# Patient Record
Sex: Male | Born: 2000 | Race: Black or African American | Hispanic: Yes | Marital: Single | Smoking: Never smoker
Health system: Southern US, Community
[De-identification: ages and names within clinical notes are randomized; demographics above are authoritative.]

---
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
## 2018-11-21 ENCOUNTER — Encounter (HOSPITAL_COMMUNITY): Payer: Self-pay | Admitting: Emergency Medicine

## 2018-11-21 ENCOUNTER — Other Ambulatory Visit: Payer: Self-pay

## 2018-11-21 ENCOUNTER — Ambulatory Visit (HOSPITAL_COMMUNITY)
Admission: EM | Admit: 2018-11-21 | Discharge: 2018-11-21 | Disposition: A | Discharge status: Nursing Home (Includes ICF) | Source: Home / Self Care

## 2018-11-21 ENCOUNTER — Emergency Department (HOSPITAL_COMMUNITY)

## 2018-11-21 ENCOUNTER — Emergency Department (HOSPITAL_COMMUNITY)
Admission: EM | Admit: 2018-11-21 | Discharge: 2018-11-21 | Disposition: A | Discharge status: Home / Self Care | Attending: Emergency Medicine | Admitting: Emergency Medicine

## 2018-11-21 DIAGNOSIS — S43014A Anterior dislocation of right humerus, initial encounter: Secondary | ICD-10-CM | POA: Diagnosis present

## 2018-11-21 DIAGNOSIS — Y33XXXA Other specified events, undetermined intent, initial encounter: Secondary | ICD-10-CM | POA: Insufficient documentation

## 2018-11-21 DIAGNOSIS — Y929 Unspecified place or not applicable: Secondary | ICD-10-CM | POA: Insufficient documentation

## 2018-11-21 DIAGNOSIS — Y998 Other external cause status: Secondary | ICD-10-CM | POA: Diagnosis not present

## 2018-11-21 DIAGNOSIS — S43004A Unspecified dislocation of right shoulder joint, initial encounter: Secondary | ICD-10-CM

## 2018-11-21 DIAGNOSIS — Y939 Activity, unspecified: Secondary | ICD-10-CM | POA: Insufficient documentation

## 2018-11-21 MED ORDER — ONDANSETRON HCL 4 MG/2ML IJ SOLN: 4.0000 mg | Freq: Once | INTRAMUSCULAR | Status: DC

## 2018-11-21 MED ORDER — HYDROCODONE-ACETAMINOPHEN 5-325 MG PO TABS
1.0000 | ORAL_TABLET | Freq: Once | ORAL | Status: AC
  Administered 2018-11-21: 1 via ORAL
  Filled 2018-11-21: qty 1

## 2018-11-21 MED ORDER — HYDROMORPHONE HCL 1 MG/ML IJ SOLN: 1.0000 mg | Freq: Once | INTRAMUSCULAR | Status: DC

## 2018-11-21 NOTE — ED Triage Notes (Signed)
Pt in from home with possible R shoulder dislocation. Hx of same in past, this happened today while he was sitting in recliner and reaching back for something.

## 2018-11-21 NOTE — ED Notes (Signed)
Patient transported to X-ray 

## 2018-11-21 NOTE — ED Notes (Signed)
Pt laid back in bed, R shoulder "pop" heard and pt stated he was able to now move full ROM. PA notified and assessed.

## 2018-11-21 NOTE — Discharge Instructions (Signed)
Please read and follow all provided instructions.  Your diagnoses today include:  1. Dislocation of right shoulder joint, initial encounter     Tests performed today include:  An x-ray of the affected area - does NOT show any broken bones  Vital signs. See below for your results today.   Medications prescribed:  Please use over-the-counter NSAID medications (ibuprofen, naproxen) as directed on the packaging for pain.   Take any prescribed medications only as directed.   Home care instructions:   Follow any educational materials contained in this packet  Follow R.I.C.E. Protocol:  R - rest your injury   I  - use ice on injury without applying directly to skin  C - compress injury with bandage or splint  E - elevate the injury as much as possible  Follow-up instructions: Please follow-up with the provided orthopedic physician (bone specialist) in 1 week. In this case you may have a more severe injury that requires further care.   Return instructions:   Please return if your fingers are numb or tingling, appear gray or blue, or you have severe pain (also elevate the arm and loosen splint or wrap if you were given one)  Please return to the Emergency Department if you experience worsening symptoms.   Please return if you have any other emergent concerns.  Additional Information:  Your vital signs today were: BP 123/79    Pulse 90    Wt 89.8 kg    SpO2 98%    BMI 27.62 kg/m  If your blood pressure (BP) was elevated above 135/85 this visit, please have this repeated by your doctor within one month. --------------

## 2018-11-21 NOTE — ED Provider Notes (Signed)
[facility] EMERGENCY DEPARTMENT Provider Note   CSN: [id number] Arrival date & time: 11/21/18  1217     History   Chief Complaint Chief Complaint  Patient presents with  . shoulder dislocation    HPI Marc Gardner is a 18 y.o. male.  Patient presents the emergency department with acute right shoulder dislocation.  Patient has had 2 shoulder dislocations that he reports in the past that resolved spontaneously.  He states that he was reaching for something while sitting on a couch today and his shoulder came out of place.  This occurred just prior to arrival.  No numbness or tingling in the arms.  He complains of pain in the shoulder and is unable to move his arm.  No other injuries.  No treatments prior to arrival.     History reviewed. No pertinent past medical history.  There are no active problems to display for this patient.   History reviewed. No pertinent surgical history.      Home Medications    Prior to Admission medications   Medication Sig Start Date End Date Taking? Authorizing Provider  ibuprofen (ADVIL,MOTRIN) 800 MG tablet Take 1 tablet (800 mg total) by mouth 3 (three) times daily. 06/20/17   Garlon Hatchet, PA-C    Family History No family history on file.  Social History Social History   Tobacco Use  . Smoking status: Never Smoker  . Smokeless tobacco: Never Used  Substance Use Topics  . Alcohol use: No  . Drug use: No     Allergies   Patient has no known allergies.   Review of Systems Review of Systems  Musculoskeletal: Positive for arthralgias. Negative for back pain, gait problem, joint swelling and neck pain.  Skin: Negative for wound.  Neurological: Negative for weakness and numbness.     Physical Exam Updated Vital Signs Wt 89.8 kg   BMI 27.62 kg/m   Physical Exam Vitals signs and nursing note reviewed.  Constitutional:      Appearance: He is well-developed.  HENT:     Head: Normocephalic  and atraumatic.  Eyes:     General:        Right eye: No discharge.        Left eye: No discharge.     Conjunctiva/sclera: Conjunctivae normal.  Neck:     Musculoskeletal: Normal range of motion and neck supple.  Cardiovascular:     Rate and Rhythm: Normal rate and regular rhythm.     Pulses:          Radial pulses are 2+ on the right side and 2+ on the left side.     Heart sounds: Normal heart sounds.  Pulmonary:     Effort: Pulmonary effort is normal.     Breath sounds: Normal breath sounds.  Abdominal:     Palpations: Abdomen is soft.     Tenderness: There is no abdominal tenderness.  Musculoskeletal:     Right shoulder: He exhibits decreased range of motion, tenderness and deformity.     Right elbow: Normal.    Cervical back: He exhibits normal range of motion, no tenderness and no bony tenderness.     Right upper arm: Normal.  Skin:    General: Skin is warm and dry.  Neurological:     Mental Status: He is alert.      ED Treatments / Results  Labs (all labs ordered are listed, but only abnormal results are displayed) Labs Reviewed - No data  to display  EKG None  Radiology Dg Shoulder Right  Result Date: 11/21/2018 CLINICAL DATA:  Possible dislocation. EXAM: RIGHT SHOULDER - 2+ VIEW COMPARISON:  Right shoulder x-rays dated September 04, 2017. FINDINGS: There is no evidence of fracture or dislocation. There is no evidence of arthropathy or other focal bone abnormality. Soft tissues are unremarkable. IMPRESSION: Negative. Electronically Signed   By: Obie DredgeWilliam T Derry M.D.   On: 11/21/2018 14:03    Procedures Procedures (including critical care time)  Medications Ordered in ED Medications  HYDROcodone-acetaminophen (NORCO/VICODIN) 5-325 MG per tablet 1 tablet (1 tablet Oral Given 11/21/18 1422)     Initial Impression / Assessment and Plan / ED Course  I have reviewed the triage vital signs and the nursing notes.  Pertinent labs & imaging results that were  available during my care of the patient were reviewed by me and considered in my medical decision making (see chart for details).     Patient seen and examined. Work-up initiated. Medications ordered.   Vital signs reviewed and are as follows: BP 123/79   Pulse 90   Wt 89.8 kg   SpO2 98%   BMI 27.62 kg/m   Called back to patient's room right after leaving.  Patient sat back in the bed and felt his shoulder "get sucked back in".  Visually, it appears that his shoulder has spontaneously reduced.  Will obtain x-ray.  2:46 PM x-ray negative.  Patient has received a sling.  Plan at this time is to discharged home with sling.  Will give orthopedic follow-up as this is the patient's third instance of a suspected shoulder dislocation.  Encouraged return with relapse or worsening.  Final Clinical Impressions(s) / ED Diagnoses   Final diagnoses:  Dislocation of right shoulder joint, initial encounter   Patient with temporary shoulder dislocation.  Patient with obvious defect visually and to palpation on initial exam.  Spontaneously reduced in the emergency department.  Home with ortho follow-up.   ED Discharge Orders    None       Renne CriglerGeiple, Loyda Costin, PA-C 11/21/18 1457    Virgina NorfolkCuratolo, Adam, DO 11/21/18 1605

## 2018-11-21 NOTE — ED Notes (Signed)
Medium sling arm foam applied to R arm.

## 2018-11-30 ENCOUNTER — Ambulatory Visit (INDEPENDENT_AMBULATORY_CARE_PROVIDER_SITE_OTHER)

## 2018-11-30 ENCOUNTER — Ambulatory Visit (INDEPENDENT_AMBULATORY_CARE_PROVIDER_SITE_OTHER): Admitting: Orthopaedic Surgery

## 2018-11-30 ENCOUNTER — Encounter (INDEPENDENT_AMBULATORY_CARE_PROVIDER_SITE_OTHER): Payer: Self-pay | Admitting: Orthopaedic Surgery

## 2018-11-30 DIAGNOSIS — G8929 Other chronic pain: Secondary | ICD-10-CM | POA: Insufficient documentation

## 2018-11-30 DIAGNOSIS — M25511 Pain in right shoulder: Secondary | ICD-10-CM
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
## 2018-12-02 ENCOUNTER — Emergency Department (HOSPITAL_COMMUNITY)

## 2018-12-02 ENCOUNTER — Encounter (HOSPITAL_COMMUNITY): Payer: Self-pay

## 2018-12-02 ENCOUNTER — Emergency Department (HOSPITAL_COMMUNITY)
Admission: EM | Admit: 2018-12-02 | Discharge: 2018-12-03 | Disposition: A | Discharge status: Home / Self Care | Attending: Emergency Medicine | Admitting: Emergency Medicine

## 2018-12-02 DIAGNOSIS — R509 Fever, unspecified: Secondary | ICD-10-CM

## 2018-12-02 DIAGNOSIS — R1110 Vomiting, unspecified: Secondary | ICD-10-CM | POA: Diagnosis not present

## 2018-12-02 DIAGNOSIS — J111 Influenza due to unidentified influenza virus with other respiratory manifestations: Secondary | ICD-10-CM | POA: Diagnosis not present

## 2018-12-02 DIAGNOSIS — R69 Illness, unspecified: Secondary | ICD-10-CM

## 2018-12-02 DIAGNOSIS — R51 Headache: Secondary | ICD-10-CM | POA: Insufficient documentation

## 2018-12-02 DIAGNOSIS — R0981 Nasal congestion: Secondary | ICD-10-CM | POA: Diagnosis not present

## 2018-12-02 DIAGNOSIS — M7918 Myalgia, other site: Secondary | ICD-10-CM | POA: Insufficient documentation

## 2018-12-02 LAB — COMPREHENSIVE METABOLIC PANEL
ALT: 11 U/L (ref 0–44)
ANION GAP: 12 (ref 5–15)
AST: 22 U/L (ref 15–41)
Albumin: 4.2 g/dL (ref 3.5–5.0)
Alkaline Phosphatase: 57 U/L (ref 38–126)
BILIRUBIN TOTAL: 0.7 mg/dL (ref 0.3–1.2)
BUN: 7 mg/dL (ref 6–20)
CHLORIDE: 106 mmol/L (ref 98–111)
CO2: 18 mmol/L — ABNORMAL LOW (ref 22–32)
Calcium: 9.4 mg/dL (ref 8.9–10.3)
Creatinine, Ser: 1 mg/dL (ref 0.61–1.24)
Glucose, Bld: 107 mg/dL — ABNORMAL HIGH (ref 70–99)
POTASSIUM: 3.3 mmol/L — AB (ref 3.5–5.1)
Sodium: 136 mmol/L (ref 135–145)
TOTAL PROTEIN: 7.4 g/dL (ref 6.5–8.1)

## 2018-12-02 LAB — CBC WITH DIFFERENTIAL/PLATELET
Abs Immature Granulocytes: 0.03 10*3/uL (ref 0.00–0.07)
BASOS ABS: 0 10*3/uL (ref 0.0–0.1)
Basophils Relative: 0 %
Eosinophils Absolute: 0.1 10*3/uL (ref 0.0–0.5)
Eosinophils Relative: 1 %
HEMATOCRIT: 40.2 % (ref 39.0–52.0)
Hemoglobin: 13.4 g/dL (ref 13.0–17.0)
Immature Granulocytes: 0 %
LYMPHS PCT: 10 %
Lymphs Abs: 0.7 10*3/uL (ref 0.7–4.0)
MCH: 30.7 pg (ref 26.0–34.0)
MCHC: 33.3 g/dL (ref 30.0–36.0)
MCV: 92 fL (ref 80.0–100.0)
Monocytes Absolute: 1 10*3/uL (ref 0.1–1.0)
Monocytes Relative: 15 %
NEUTROS ABS: 5.1 10*3/uL (ref 1.7–7.7)
NEUTROS PCT: 74 %
NRBC: 0 % (ref 0.0–0.2)
Platelets: 230 10*3/uL (ref 150–400)
RBC: 4.37 MIL/uL (ref 4.22–5.81)
RDW: 11.8 % (ref 11.5–15.5)
WBC: 6.9 10*3/uL (ref 4.0–10.5)

## 2018-12-02 MED ORDER — ACETAMINOPHEN 500 MG PO TABS
1000.0000 mg | ORAL_TABLET | Freq: Once | ORAL | Status: AC
  Administered 2018-12-02: 1000 mg via ORAL
  Filled 2018-12-02: qty 2

## 2018-12-02 NOTE — ED Triage Notes (Signed)
Pt endorses flu sx with cough, bodyaches, fever, vomiting that began this morning. Did not get flu shot. Febrile.

## 2018-12-03 MED ORDER — OSELTAMIVIR PHOSPHATE 75 MG PO CAPS: 75.0000 mg | ORAL_CAPSULE | Freq: Two times a day (BID) | ORAL | 0 refills | Status: DC

## 2018-12-03 MED ORDER — ACETAMINOPHEN 500 MG PO TABS
1000.0000 mg | ORAL_TABLET | Freq: Once | ORAL | Status: AC
  Administered 2018-12-03: 1000 mg via ORAL
  Filled 2018-12-03: qty 2

## 2018-12-03 MED ORDER — IBUPROFEN 200 MG PO TABS
600.0000 mg | ORAL_TABLET | Freq: Once | ORAL | Status: AC
  Administered 2018-12-03: 02:00:00 600 mg via ORAL
  Filled 2018-12-03: qty 1

## 2018-12-03 MED ORDER — ONDANSETRON 4 MG PO TBDP: ORAL_TABLET | ORAL | 0 refills | Status: DC

## 2018-12-03 MED ORDER — POTASSIUM CHLORIDE CRYS ER 20 MEQ PO TBCR
40.0000 meq | EXTENDED_RELEASE_TABLET | Freq: Once | ORAL | Status: AC
  Administered 2018-12-03: 40 meq via ORAL
  Filled 2018-12-03: qty 2

## 2018-12-03 MED ORDER — SODIUM CHLORIDE 0.9 % IV BOLUS
1000.0000 mL | Freq: Once | INTRAVENOUS | Status: AC
  Administered 2018-12-03: 1000 mL via INTRAVENOUS

## 2018-12-03 MED ORDER — BENZONATATE 100 MG PO CAPS: 100.0000 mg | ORAL_CAPSULE | Freq: Three times a day (TID) | ORAL | 0 refills | Status: DC | PRN

## 2018-12-03 MED ORDER — SODIUM CHLORIDE 0.9 % IV BOLUS
1000.0000 mL | Freq: Once | INTRAVENOUS | Status: AC
Start: 2018-12-03 — End: 2018-12-03
  Administered 2018-12-03: 1000 mL via INTRAVENOUS

## 2018-12-03 NOTE — ED Provider Notes (Signed)
[facility] EMERGENCY DEPARTMENT Provider Note   CSN: [id number] Arrival date & time: 12/02/18  1846     History   Chief Complaint Chief Complaint  Patient presents with  . Influenza    HPI Marc Gardner is a 18 y.o. male with a hx of eosinophilic esophagitis presents to the Emergency Department complaining of gradual, persistent, progressively worsening myalgias onset this morning. Associated symptoms include chills, rigors, body aches, nasal congestion, cough, post nasal drip, mild headache, emesis x2.  Pt reports emesis was NBNB.  Pt denies known sick contacts.  He did not get a flu shot this year.  Pt reports taking dayquil without relief of symptoms.   No aggravating factors.  Pt denies neck pain, chest pain, SOB, abd pain, syncope, dysuria.       The history is provided by the patient and medical records. No language interpreter was used.    History reviewed. No pertinent past medical history.  Patient Active Problem List   Diagnosis Date Noted  . Chronic right shoulder pain 11/30/2018    History reviewed. No pertinent surgical history.      Home Medications    Prior to Admission medications   Medication Sig Start Date End Date Taking? Authorizing Provider  benzonatate (TESSALON PERLES) 100 MG capsule Take 1 capsule (100 mg total) by mouth 3 (three) times daily as needed for cough (cough). 12/03/18   Juleah Paradise, Dahlia ClientHannah, PA-C  ondansetron (ZOFRAN ODT) 4 MG disintegrating tablet 4mg  ODT q4 hours prn nausea/vomit 12/03/18   Elijah Michaelis, Dahlia ClientHannah, PA-C  oseltamivir (TAMIFLU) 75 MG capsule Take 1 capsule (75 mg total) by mouth every 12 (twelve) hours. 12/03/18   Shyvonne Chastang, Dahlia ClientHannah, PA-C    Family History History reviewed. No pertinent family history.  Social History Social History   Tobacco Use  . Smoking status: Never Smoker  . Smokeless tobacco: Never Used  Substance Use Topics  . Alcohol use: No  . Drug use: No     Allergies     Patient has no known allergies.   Review of Systems Review of Systems  Constitutional: Positive for chills, fatigue and fever. Negative for appetite change.  HENT: Positive for congestion, postnasal drip, rhinorrhea, sinus pressure and sore throat. Negative for ear discharge, ear pain and mouth sores.   Eyes: Negative for visual disturbance.  Respiratory: Positive for cough, chest tightness, shortness of breath and wheezing. Negative for stridor.   Cardiovascular: Negative for chest pain, palpitations and leg swelling.  Gastrointestinal: Positive for vomiting ( x2). Negative for abdominal pain, diarrhea and nausea.  Genitourinary: Negative for dysuria, frequency, hematuria and urgency.  Musculoskeletal: Negative for arthralgias, back pain, myalgias and neck stiffness.  Skin: Negative for rash.  Neurological: Positive for headaches. Negative for syncope, light-headedness and numbness.  Hematological: Negative for adenopathy.  Psychiatric/Behavioral: The patient is not nervous/anxious.   All other systems reviewed and are negative.    Physical Exam Updated Vital Signs BP 121/69 (BP Location: Right Arm)   Pulse (!) 115   Temp (!) 103 F (39.4 C) (Oral)   Resp 18   SpO2 100%   Physical Exam Vitals signs and nursing note reviewed.  Constitutional:      General: He is not in acute distress.    Appearance: He is well-developed. He is not diaphoretic.     Comments: Pt with rigors  HENT:     Head: Normocephalic and atraumatic.     Right Ear: Tympanic membrane, ear canal and external ear normal.  Left Ear: Tympanic membrane, ear canal and external ear normal.     Nose: Mucosal edema and rhinorrhea present.     Right Sinus: No maxillary sinus tenderness or frontal sinus tenderness.     Left Sinus: No maxillary sinus tenderness or frontal sinus tenderness.     Mouth/Throat:     Mouth: Mucous membranes are not pale and not cyanotic.     Pharynx: Uvula midline. No oropharyngeal  exudate or posterior oropharyngeal erythema.     Tonsils: No tonsillar abscesses.  Eyes:     Conjunctiva/sclera: Conjunctivae normal.     Pupils: Pupils are equal, round, and reactive to light.  Neck:     Musculoskeletal: Full passive range of motion without pain and normal range of motion.  Cardiovascular:     Rate and Rhythm: Tachycardia present.  Pulmonary:     Effort: Pulmonary effort is normal.     Breath sounds: Normal breath sounds. No stridor.  Abdominal:     Palpations: Abdomen is soft.     Tenderness: There is no abdominal tenderness.  Musculoskeletal: Normal range of motion.  Lymphadenopathy:     Cervical: No cervical adenopathy.  Skin:    General: Skin is warm and dry.     Findings: No rash.     Comments: Hot to touch  Neurological:     Mental Status: He is alert.      ED Treatments / Results  Labs (all labs ordered are listed, but only abnormal results are displayed) Labs Reviewed  COMPREHENSIVE METABOLIC PANEL - Abnormal; Notable for the following components:      Result Value   Potassium 3.3 (*)    CO2 18 (*)    Glucose, Bld 107 (*)    All other components within normal limits  CBC WITH DIFFERENTIAL/PLATELET    Radiology Dg Chest 2 View  Result Date: 12/02/2018 CLINICAL DATA:  Cough.  Flu symptoms. EXAM: CHEST - 2 VIEW COMPARISON:  None. FINDINGS: The lungs are clear without focal pneumonia, edema, pneumothorax or pleural effusion. The cardiopericardial silhouette is within normal limits for size. The visualized bony structures of the thorax are intact. IMPRESSION: No active cardiopulmonary disease. Electronically Signed   By: Kennith Center M.D.   On: 12/02/2018 19:31    Procedures Procedures (including critical care time)  Medications Ordered in ED Medications  acetaminophen (TYLENOL) tablet 1,000 mg (1,000 mg Oral Given 12/02/18 1856)  ibuprofen (ADVIL,MOTRIN) tablet 600 mg (600 mg Oral Given 12/03/18 0137)  potassium chloride SA (K-DUR,KLOR-CON)  CR tablet 40 mEq (40 mEq Oral Given 12/03/18 0136)  sodium chloride 0.9 % bolus 1,000 mL (0 mLs Intravenous Stopped 12/03/18 0311)  sodium chloride 0.9 % bolus 1,000 mL (0 mLs Intravenous Stopped 12/03/18 0355)  acetaminophen (TYLENOL) tablet 1,000 mg (1,000 mg Oral Given 12/03/18 0328)     Initial Impression / Assessment and Plan / ED Course  I have reviewed the triage vital signs and the nursing notes.  Pertinent labs & imaging results that were available during my care of the patient were reviewed by me and considered in my medical decision making (see chart for details).  Clinical Course as of Dec 04 531  Fri Dec 03, 2018  0123 Pt continues to be febrile  Temp(!): 102.4 F (39.1 C) [HM]  0123 Noted.  Potassium given  Potassium(!): 3.3 [HM]  0320 Pt continues to have fever.  Additional antipyretic given.    Temp(!): 102.5 F (39.2 C) [HM]  0532 Fever decreased  Temp: 98.5  F (36.9 C) [HM]  0532 Pt reports he is feeling better.  No longer tachycardic on exam.  He has tolerated PO and feels comfortable with d/c home.     [HM]    Clinical Course User Index [HM] Treesa Mccully, Dahlia Client, [place]    Patient with symptoms consistent with influenza.  Patient with persistent fever.  Several rounds of antipyretic given before fever breaks.  Patient given IV fluids.  He has tolerated p.o.'s without difficulty here in the emergency department.  Lungs are clear.  X-ray is without consolidation to suggest pneumonia.  Personally evaluated these images.  Discussed the cost versus benefit of Tamiflu treatment with the patient.  He is within the 48-hour window therefore will write a prescription.  Patient will be discharged with instructions to orally hydrate, rest, and use over-the-counter medications such as anti-inflammatories ibuprofen and Aleve for muscle aches and Tylenol for fever.  Patient will also be given a cough suppressant.    Final Clinical Impressions(s) / ED Diagnoses   Final diagnoses:    Influenza-like illness  Fever, unspecified fever cause    ED Discharge Orders         Ordered    ondansetron (ZOFRAN ODT) 4 MG disintegrating tablet     12/03/18 0531    oseltamivir (TAMIFLU) 75 MG capsule  Every 12 hours     12/03/18 0531    benzonatate (TESSALON PERLES) 100 MG capsule  3 times daily PRN     12/03/18 0531           Pike Scantlebury, Dahlia Client, PA-C 12/03/18 0533    Dione Booze, MD 12/03/18 [phone]

## 2018-12-03 NOTE — Discharge Instructions (Addendum)
1. Medications: Tamiflu, Zofran, Tessalon, alternate tylenol and ibuprofen for fever control, usual home medications 2. Treatment: rest, drink plenty of fluids,  3. Follow Up: Please followup with your primary doctor in 2 days for discussion of your diagnoses and further evaluation after today's visit; if you do not have a primary care doctor use the resource guide provided to find one; Please return to the ER for syncope, difficulty breathing, persistent high fevers, intractable vomiting or other concerns

## 2019-01-11 ENCOUNTER — Encounter (INDEPENDENT_AMBULATORY_CARE_PROVIDER_SITE_OTHER): Payer: Self-pay

## 2019-01-11 ENCOUNTER — Ambulatory Visit (INDEPENDENT_AMBULATORY_CARE_PROVIDER_SITE_OTHER): Admitting: Orthopaedic Surgery

## 2019-10-08 IMAGING — DX DG CHEST 2V
2 series · 2 of 2 positions shown · non-contrast
Comparison: None.

CLINICAL DATA: Cough.  Flu symptoms.

EXAM:
CHEST - 2 VIEW

[w chest pa]
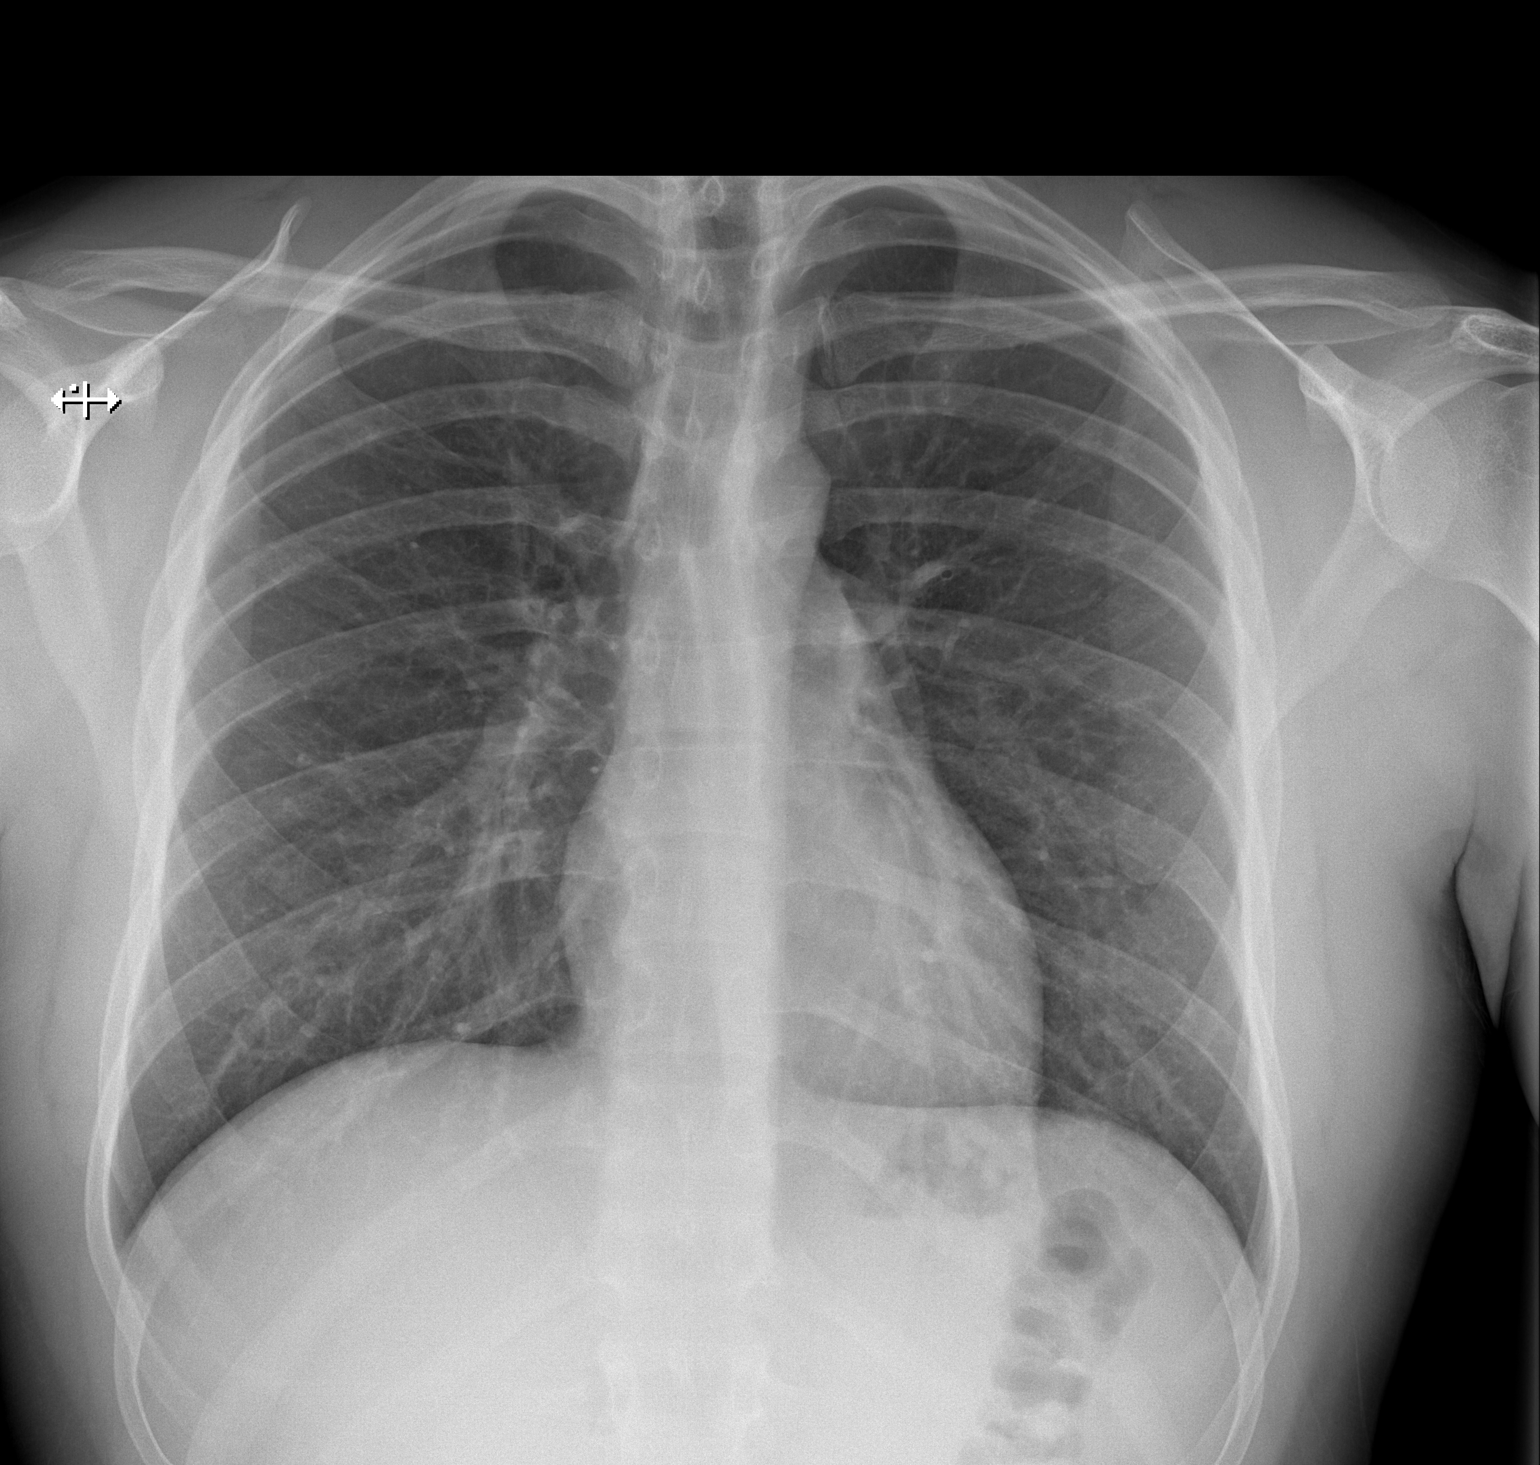

[w chest lat]
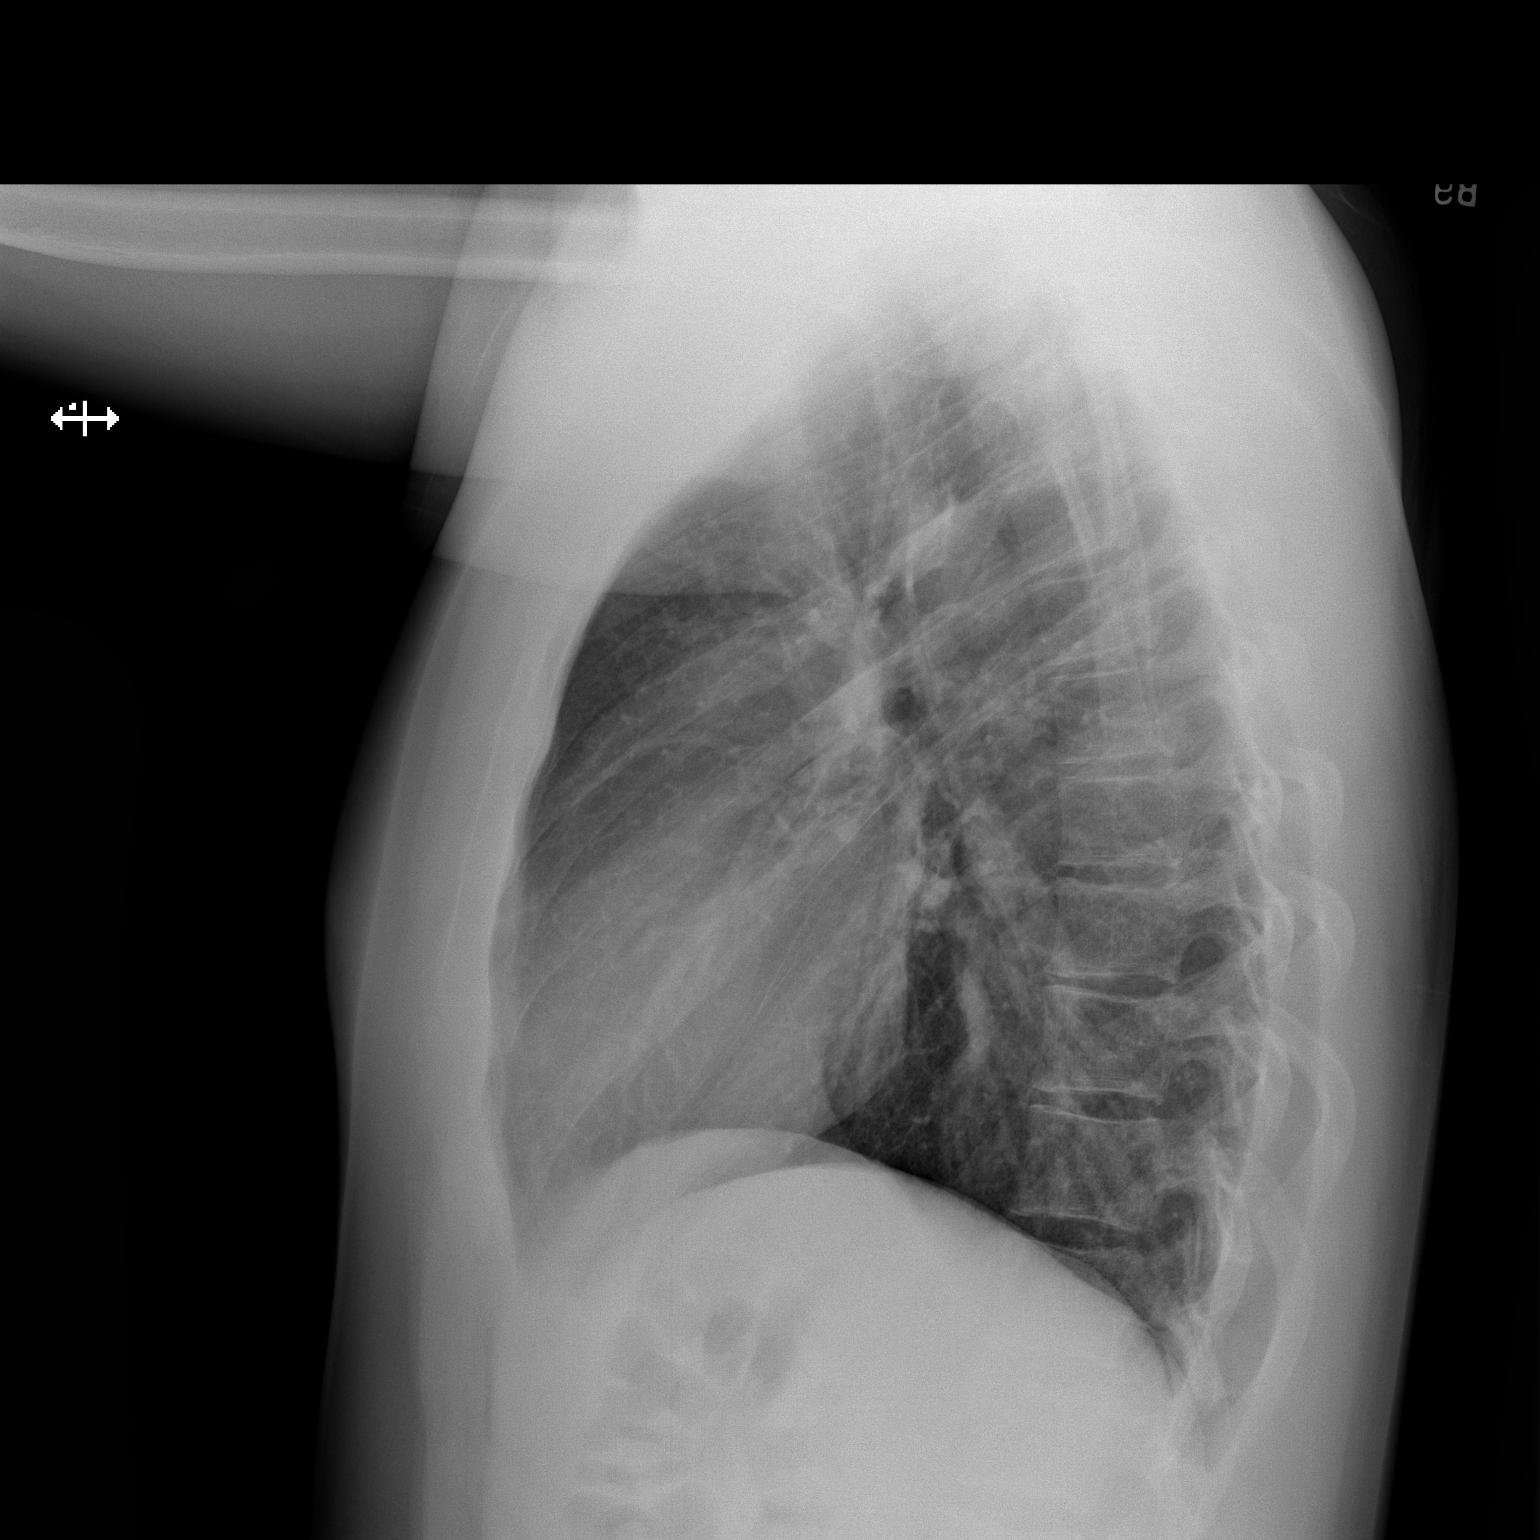

[2 of 2 positions shown; findings below may reference images not displayed]

FINDINGS: The lungs are clear without focal pneumonia, edema, pneumothorax or
pleural effusion. The cardiopericardial silhouette is within normal
limits for size. The visualized bony structures of the thorax are
intact.
IMPRESSION: No active cardiopulmonary disease.

## 2019-10-15 ENCOUNTER — Emergency Department (HOSPITAL_COMMUNITY)

## 2019-10-15 ENCOUNTER — Encounter (HOSPITAL_COMMUNITY): Payer: Self-pay | Admitting: Emergency Medicine

## 2019-10-15 ENCOUNTER — Other Ambulatory Visit: Payer: Self-pay

## 2019-10-15 ENCOUNTER — Emergency Department (HOSPITAL_COMMUNITY)
Admission: EM | Admit: 2019-10-15 | Discharge: 2019-10-15 | Disposition: A | Discharge status: Home / Self Care | Attending: Emergency Medicine | Admitting: Emergency Medicine

## 2019-10-15 DIAGNOSIS — M79672 Pain in left foot: Secondary | ICD-10-CM | POA: Diagnosis not present

## 2019-10-15 MED ORDER — ACETAMINOPHEN 500 MG PO TABS: 500.0000 mg | ORAL_TABLET | Freq: Four times a day (QID) | ORAL | 0 refills | Status: AC | PRN

## 2019-10-15 MED ORDER — IBUPROFEN 600 MG PO TABS: 600.0000 mg | ORAL_TABLET | Freq: Four times a day (QID) | ORAL | 0 refills | Status: AC | PRN

## 2019-10-15 NOTE — ED Provider Notes (Signed)
[facility] EMERGENCY DEPARTMENT Provider Note   CSN: [id number] Arrival date & time: 10/15/19  2633     History   Chief Complaint Chief Complaint  Patient presents with  . Foot Pain    HPI Marc Gardner is a 18 y.o. male presents for evaluation of acute onset, progressively worsening left foot pain for 2 days.  Reports symptoms began possibly after jumping up to grab a tree branch from a tree and landing with some force.  Denies any significant known injury otherwise.  Notes pain is sharp and he only really feels it with palpation and weightbearing.  It is worse with the first step of the day.  He has been stretching the area at night with some improvement and took Midol without any improvement.  Denies swelling, numbness, fevers, skin color change.  Pain does not radiate.     The history is provided by the patient.    History reviewed. No pertinent past medical history.  Patient Active Problem List   Diagnosis Date Noted  . Chronic right shoulder pain 11/30/2018    History reviewed. No pertinent surgical history.      Home Medications    Prior to Admission medications   Medication Sig Start Date End Date Taking? Authorizing Provider  acetaminophen (TYLENOL) 500 MG tablet Take 1 tablet (500 mg total) by mouth every 6 (six) hours as needed. 10/15/19   Shaylie Eklund A, PA-C  benzonatate (TESSALON PERLES) 100 MG capsule Take 1 capsule (100 mg total) by mouth 3 (three) times daily as needed for cough (cough). 12/03/18   Muthersbaugh, Dahlia Client, PA-C  ibuprofen (ADVIL) 600 MG tablet Take 1 tablet (600 mg total) by mouth every 6 (six) hours as needed. 10/15/19   Luevenia Maxin, Wolf Boulay A, PA-C  ondansetron (ZOFRAN ODT) 4 MG disintegrating tablet 4mg  ODT q4 hours prn nausea/vomit 12/03/18   Muthersbaugh, 12/05/18, PA-C  oseltamivir (TAMIFLU) 75 MG capsule Take 1 capsule (75 mg total) by mouth every 12 (twelve) hours. 12/03/18   Muthersbaugh, 12/05/18, PA-C    Family History No  family history on file.  Social History Social History   Tobacco Use  . Smoking status: Never Smoker  . Smokeless tobacco: Never Used  Substance Use Topics  . Alcohol use: No  . Drug use: Yes    Types: Marijuana     Allergies   Patient has no known allergies.   Review of Systems Review of Systems  Constitutional: Negative for fever.  Cardiovascular: Negative for leg swelling.  Musculoskeletal: Positive for arthralgias.  Neurological: Negative for weakness and numbness.     Physical Exam Updated Vital Signs BP 127/80 (BP Location: Left Arm)   Pulse 74   Temp 98.1 F (36.7 C) (Oral)   Resp 16   SpO2 97%   Physical Exam Vitals signs and nursing note reviewed.  Constitutional:      General: He is not in acute distress.    Appearance: He is well-developed.  HENT:     Head: Normocephalic and atraumatic.  Eyes:     General:        Right eye: No discharge.        Left eye: No discharge.     Conjunctiva/sclera: Conjunctivae normal.  Neck:     Vascular: No JVD.     Trachea: No tracheal deviation.  Cardiovascular:     Rate and Rhythm: Normal rate.     Pulses: Normal pulses.     Comments: 2+ DP/PT pulses bilaterally, Homans  sign absent bilaterally, no lower extremity edema, no palpable cords, compartments are soft  Pulmonary:     Effort: Pulmonary effort is normal.  Abdominal:     General: There is no distension.  Musculoskeletal:        General: Tenderness present.       Feet:     Comments: Tenderness to palpation of the left heel.  No swelling, erythema, induration, or crepitus.  5/5 strength of left foot and ankle with plantarflexion, dorsiflexion, inversion, and eversion against resistance.  Normal EHL strength.  Examination of the Achilles tendon is within normal limits.  Skin:    General: Skin is warm and dry.     Findings: No erythema.  Neurological:     Mental Status: He is alert.     Comments: 5/5 strength of BLE major muscle groups.  Sensation  intact to light touch of bilateral lower extremities.  Patient walks with antalgic gait but able to heel walk and toe walk without difficulty, and exhibits steady gait and balance.  Psychiatric:        Behavior: Behavior normal.      ED Treatments / Results  Labs (all labs ordered are listed, but only abnormal results are displayed) Labs Reviewed - No data to display  EKG None  Radiology Dg Foot Complete Left  Result Date: 10/15/2019 CLINICAL DATA:  Foot pain x1 week EXAM: LEFT FOOT - COMPLETE 3+ VIEW COMPARISON:  None. FINDINGS: No fracture or dislocation is seen. The joint spaces are preserved. The visualized soft tissues are unremarkable. IMPRESSION: Negative. Electronically Signed   By: Julian Hy M.D.   On: 10/15/2019 11:45    Procedures Procedures (including critical care time)  Medications Ordered in ED Medications - No data to display   Initial Impression / Assessment and Plan / ED Course  I have reviewed the triage vital signs and the nursing notes.  Pertinent labs & imaging results that were available during my care of the patient were reviewed by me and considered in my medical decision making (see chart for details).        Patient presenting for evaluation of left foot pain for 2 or 3 days.  He is afebrile, vital signs are stable.  He is nontoxic in appearance.  He is neurovascularly intact.  Ambulatory despite pain. No signs of secondary skin infection.  Examination is inconsistent with DVT.  Radiographs show no evidence of acute osseous abnormality or soft tissue abnormality.  Physical examination and history concerning for plantar fasciitis.  Discussed conservative therapy and management with CAM Walker, NSAIDs, Tylenol, stretching exercises.  Recommend follow-up with orthopedist for reevaluation of symptoms and he has seen Dr. Erlinda Hong previously for shoulder pain/dislocations.  Discussed strict ED return precautions. Patient verbalized understanding of and  agreement with plan and is safe for discharge home at this time.   Final Clinical Impressions(s) / ED Diagnoses   Final diagnoses:  Acute foot pain, left    ED Discharge Orders         Ordered    ibuprofen (ADVIL) 600 MG tablet  Every 6 hours PRN     10/15/19 1210    acetaminophen (TYLENOL) 500 MG tablet  Every 6 hours PRN     10/15/19 1210           Renita Papa, PA-C 10/15/19 1213    Hayden Rasmussen, MD 10/15/19 [phone]

## 2019-10-15 NOTE — ED Notes (Signed)
Pt. refused the crutches per ortho tech.

## 2019-10-15 NOTE — ED Triage Notes (Addendum)
C/o pain to bottom of L foot x 2 days.  No known injury.  Pain only with weight bearing.

## 2019-10-15 NOTE — Progress Notes (Signed)
Orthopedic Tech Progress Note Patient Details:  Marc Gardner 11-27-2000 [id number] Patient did not want the crutches. I told the RN Ortho Devices Type of Ortho Device: CAM walker Ortho Device/Splint Location: LLE Ortho Device/Splint Interventions: Adjustment, Application, Ordered   Post Interventions Patient Tolerated: Ambulated well, Well Instructions Provided: Poper ambulation with device, Care of device, Adjustment of device   Janit Pagan 10/15/2019, 1:19 PM

## 2019-10-15 NOTE — Discharge Instructions (Addendum)
Your history and physical examination and x-rays were consistent with plantar fasciitis.  You may alternate 600 mg of ibuprofen and [phone] mg of Tylenol every 3 hours as needed for pain for the next few days. Do not exceed 4000 mg of Tylenol daily.  Take ibuprofen with food to avoid upset stomach issues.  Do some gentle stretching exercises in the morning and throughout the day to help with your pain.  You can freeze a water bottle and then roll it gently over the bottom of your foot.  I have also attached instructions on how to tape your feet which can help ease your pain.  Wear the CAM walker for comfort.  Follow-up with podiatry for reevaluation of your symptoms.  They may recommend injecting a steroid medicine or custom orthotics.  Make sure to wear shoes that fit well.  Return to the emergency department if any concerning signs or symptoms develop such as fevers, severe swelling, loss of pulses or pallor to the extremity, or persistent numbness/weakness.

## 2020-03-01 ENCOUNTER — Emergency Department (HOSPITAL_COMMUNITY)
Admission: EM | Admit: 2020-03-01 | Discharge: 2020-03-01 | Disposition: A | Discharge status: Home / Self Care | Attending: Emergency Medicine | Admitting: Emergency Medicine

## 2020-03-01 ENCOUNTER — Other Ambulatory Visit: Payer: Self-pay

## 2020-03-01 ENCOUNTER — Emergency Department (HOSPITAL_COMMUNITY)

## 2020-03-01 ENCOUNTER — Encounter (HOSPITAL_COMMUNITY): Payer: Self-pay | Admitting: Emergency Medicine

## 2020-03-01 DIAGNOSIS — S61210A Laceration without foreign body of right index finger without damage to nail, initial encounter: Secondary | ICD-10-CM | POA: Insufficient documentation

## 2020-03-01 DIAGNOSIS — Y999 Unspecified external cause status: Secondary | ICD-10-CM | POA: Insufficient documentation

## 2020-03-01 DIAGNOSIS — W260XXA Contact with knife, initial encounter: Secondary | ICD-10-CM | POA: Insufficient documentation

## 2020-03-01 DIAGNOSIS — Y9301 Activity, walking, marching and hiking: Secondary | ICD-10-CM | POA: Insufficient documentation

## 2020-03-01 DIAGNOSIS — Y929 Unspecified place or not applicable: Secondary | ICD-10-CM | POA: Diagnosis not present

## 2020-03-01 MED ORDER — TETANUS-DIPHTH-ACELL PERTUSSIS 5-2.5-18.5 LF-MCG/0.5 IM SUSP: 0.5000 mL | Freq: Once | INTRAMUSCULAR | Status: DC

## 2020-03-01 NOTE — ED Notes (Signed)
Patient given discharge instructions patient verbalizes understanding. 

## 2020-03-01 NOTE — ED Triage Notes (Signed)
Patient reports skin laceration at right distal index finger sustained from a razor blade accidentally hit while at work last night , bleeding controlled .

## 2020-03-01 NOTE — ED Provider Notes (Signed)
[facility] EMERGENCY DEPARTMENT Provider Note   CSN: [id number] Arrival date & time: 03/01/20  0357     History Chief Complaint  Patient presents with  . Laceration    finger    Marc Gardner is a 19 y.o. male.  The history is provided by the patient. No language interpreter was used.  Laceration Associated symptoms: no fever      19 year old male presenting for evaluation of finger injury.  Patient report last night he was walking, accidentally cut his right index finger with a razor blade.  He put a Band-Aid on it and went to sleep, woke up this morning noticing blood oozing from the wound and decided to come to ER for evaluation.  At this time his pain is minimal, denies any associated numbness.  He is right-hand dominant.  He cannot recall his last tetanus status.  He has no other complaint.  History reviewed. No pertinent past medical history.  Patient Active Problem List   Diagnosis Date Noted  . Chronic right shoulder pain 11/30/2018    History reviewed. No pertinent surgical history.     No family history on file.  Social History   Tobacco Use  . Smoking status: Never Smoker  . Smokeless tobacco: Never Used  Substance Use Topics  . Alcohol use: No  . Drug use: Yes    Types: Marijuana    Home Medications Prior to Admission medications   Medication Sig Start Date End Date Taking? Authorizing Provider  acetaminophen (TYLENOL) 500 MG tablet Take 1 tablet (500 mg total) by mouth every 6 (six) hours as needed. 10/15/19   Fawze, Mina A, PA-C  benzonatate (TESSALON PERLES) 100 MG capsule Take 1 capsule (100 mg total) by mouth 3 (three) times daily as needed for cough (cough). 12/03/18   Muthersbaugh, Dahlia Client, PA-C  ibuprofen (ADVIL) 600 MG tablet Take 1 tablet (600 mg total) by mouth every 6 (six) hours as needed. 10/15/19   Luevenia Maxin, Mina A, PA-C  ondansetron (ZOFRAN ODT) 4 MG disintegrating tablet 4mg  ODT q4 hours prn nausea/vomit 12/03/18    Muthersbaugh, 12/05/18, PA-C  oseltamivir (TAMIFLU) 75 MG capsule Take 1 capsule (75 mg total) by mouth every 12 (twelve) hours. 12/03/18   Muthersbaugh, 12/05/18, PA-C    Allergies    Patient has no known allergies.  Review of Systems   Review of Systems  Constitutional: Negative for fever.  Skin: Positive for wound.  Neurological: Negative for numbness.    Physical Exam Updated Vital Signs BP 118/73 (BP Location: Left Arm)   Pulse 61   Temp 97.6 F (36.4 C) (Oral)   Resp 20   Ht 5\' 11"  (1.803 m)   Wt 90 kg   SpO2 100%   BMI 27.67 kg/m   Physical Exam Vitals and nursing note reviewed.  Constitutional:      General: He is not in acute distress.    Appearance: He is well-developed.  HENT:     Head: Atraumatic.  Eyes:     Conjunctiva/sclera: Conjunctivae normal.  Musculoskeletal:        General: Signs of injury (R index finger: superficial 1cm wound noted to dorsum of distal phalanx without nail involvement.  sensation intact, no fb noted no joint involvement. ) present.     Cervical back: Neck supple.  Skin:    Findings: No rash.  Neurological:     Mental Status: He is alert.     ED Results / Procedures / Treatments  Labs (all labs ordered are listed, but only abnormal results are displayed) Labs Reviewed - No data to display  EKG None  Radiology DG Finger Index Right  Result Date: 03/01/2020 CLINICAL DATA:  Laceration. EXAM: RIGHT INDEX FINGER 2+V COMPARISON:  None. FINDINGS: Soft tissue injury is evident in the right index finger. No foreign body is present. Acute fracture dislocation is present. IMPRESSION: Soft tissue injury without underlying fracture or foreign body. Electronically Signed   By: San Morelle M.D.   On: 03/01/2020 04:26    Procedures .Marland KitchenLaceration Repair  Date/Time: 03/01/2020 8:39 AM Performed by: Domenic Moras, PA-C Authorized by: Domenic Moras, PA-C   Consent:    Consent obtained:  Verbal   Consent given by:  Patient   Risks  discussed:  Infection, need for additional repair, pain, poor cosmetic result and poor wound healing   Alternatives discussed:  No treatment and delayed treatment Universal protocol:    Procedure explained and questions answered to patient or proxy's satisfaction: yes     Relevant documents present and verified: yes     Test results available and properly labeled: yes     Imaging studies available: yes     Required blood products, implants, devices, and special equipment available: yes     Site/side marked: yes     Immediately prior to procedure, a time out was called: yes     Patient identity confirmed:  Verbally with patient Anesthesia (see MAR for exact dosages):    Anesthesia method:  Local infiltration Laceration details:    Location:  Finger   Finger location:  R index finger   Length (cm):  1   Depth (mm):  2 Repair type:    Repair type:  Simple Pre-procedure details:    Preparation:  Imaging obtained to evaluate for foreign bodies Exploration:    Wound exploration: wound explored through full range of motion and entire depth of wound probed and visualized     Contaminated: no   Treatment:    Area cleansed with:  Soap and water   Amount of cleaning:  Standard Skin repair:    Repair method:  Tissue adhesive Approximation:    Approximation:  Close Post-procedure details:    Dressing:  Open (no dressing)   Patient tolerance of procedure:  Tolerated well, no immediate complications   (including critical care time)  Medications Ordered in ED Medications  Tdap (BOOSTRIX) injection 0.5 mL (has no administration in time range)    ED Course  I have reviewed the triage vital signs and the nursing notes.  Pertinent labs & imaging results that were available during my care of the patient were reviewed by me and considered in my medical decision making (see chart for details).    MDM Rules/Calculators/A&P                      BP 118/73 (BP Location: Left Arm)   Pulse 61    Temp 97.6 F (36.4 C) (Oral)   Resp 20   Ht 5\' 11"  (1.803 m)   Wt 90 kg   SpO2 100%   BMI 27.67 kg/m   Final Clinical Impression(s) / ED Diagnoses Final diagnoses:  Laceration of right index finger without foreign body without damage to nail, initial encounter    Rx / DC Orders ED Discharge Orders    None     8:12 AM Pt suffered superficial lac to R index finger dorsally . Wound amenable for dermabond, no nail  involvement.  Pt is NVI. Wound is not contaminated.    Fayrene Helper, PA-C 03/01/20 4315    Tegeler, Canary Brim, MD 03/01/20 1010

## 2020-10-13 ENCOUNTER — Ambulatory Visit: Payer: Self-pay | Attending: Internal Medicine

## 2020-10-13 DIAGNOSIS — Z23 Encounter for immunization: Secondary | ICD-10-CM

## 2020-10-13 NOTE — Progress Notes (Signed)
   Covid-19 Vaccination Clinic  Name:  Marc Gardner    MRN: [id number] DOB: [birth year]  10/13/2020  Mr. Marc Gardner was observed post Covid-19 immunization for 15 minutes without incident. He was provided with Vaccine Information Sheet and instruction to access the V-Safe system.   Mr. Marc Gardner was instructed to call 911 with any severe reactions post vaccine: Marland Kitchen Difficulty breathing  . Swelling of face and throat  . A fast heartbeat  . A bad rash all over body  . Dizziness and weakness   Immunizations Administered    Name Date Dose VIS Date Route   Pfizer COVID-19 Vaccine 10/13/2020 11:39 AM 0.3 mL 08/29/2020 Intramuscular   Manufacturer: [employer], [employer]   Lot: [id number]   NDC: [address]-4

## 2021-09-10 ENCOUNTER — Other Ambulatory Visit: Payer: Self-pay

## 2021-09-10 ENCOUNTER — Ambulatory Visit (HOSPITAL_COMMUNITY)
Admission: EM | Admit: 2021-09-10 | Discharge: 2021-09-10 | Disposition: A | Discharge status: Home / Self Care | Payer: Self-pay | Attending: Internal Medicine | Admitting: Internal Medicine

## 2021-09-10 ENCOUNTER — Encounter (HOSPITAL_COMMUNITY): Payer: Self-pay

## 2021-09-10 DIAGNOSIS — Z113 Encounter for screening for infections with a predominantly sexual mode of transmission: Secondary | ICD-10-CM | POA: Insufficient documentation

## 2021-09-10 DIAGNOSIS — R369 Urethral discharge, unspecified: Secondary | ICD-10-CM | POA: Insufficient documentation

## 2021-09-10 DIAGNOSIS — R300 Dysuria: Secondary | ICD-10-CM | POA: Insufficient documentation

## 2021-09-10 LAB — POCT URINALYSIS DIPSTICK, ED / UC
Bilirubin Urine: NEGATIVE
Glucose, UA: NEGATIVE mg/dL
Hgb urine dipstick: NEGATIVE
Ketones, ur: NEGATIVE mg/dL
Leukocytes,Ua: NEGATIVE
Nitrite: NEGATIVE
Protein, ur: NEGATIVE mg/dL
Specific Gravity, Urine: 1.025 (ref 1.005–1.030)
Urobilinogen, UA: 0.2 mg/dL (ref 0.0–1.0)
pH: 6.5 (ref 5.0–8.0)

## 2021-09-10 NOTE — Discharge Instructions (Addendum)
-  Your urine looks normal, you do not have a urinary or kidney infection. -We are testing for sexually transmitted diseases, this is a common cause of burning when peeing and penile discharge in young man.  We will call in about 2 days with positive results.  Abstain until negative test results or treatment is complete. -I cannot rule out a kidney stone at this time, though it is unlikely based on your exam.  If you develop new symptoms like severe abdominal pain or severe back pain, head to the emergency department.

## 2021-09-10 NOTE — ED Provider Notes (Signed)
Marc Gardner    CSN: [id number] Arrival date & time: 09/10/21  1858      History   Chief Complaint Chief Complaint  Patient presents with   Flank Pain    HPI Marc Gardner is a 20 y.o. male presenting with dysuria for 2 weeks and left flank pain for 2 days following intercourse with new male partner.  Medical history noncontributory.  Patient describes cloudy urine, intermittent dysuria, constant left flank pain.  Saw some white discharge 1 day ago.  States that he does have a new male partner though they used protection. Denies hematuria, frequency, urgency, n/v/d.   HPI  History reviewed. No pertinent past medical history.  Patient Active Problem List   Diagnosis Date Noted   Chronic right shoulder pain 11/30/2018    History reviewed. No pertinent surgical history.     Home Medications    Prior to Admission medications   Medication Sig Start Date End Date Taking? Authorizing Provider  acetaminophen (TYLENOL) 500 MG tablet Take 1 tablet (500 mg total) by mouth every 6 (six) hours as needed. 10/15/19   Fawze, Mina A, PA-C  ibuprofen (ADVIL) 600 MG tablet Take 1 tablet (600 mg total) by mouth every 6 (six) hours as needed. 10/15/19   Renita Papa, PA-C    Family History History reviewed. No pertinent family history.  Social History Social History   Tobacco Use   Smoking status: Never   Smokeless tobacco: Never  Substance Use Topics   Alcohol use: No   Drug use: Yes    Types: Marijuana     Allergies   Patient has no known allergies.   Review of Systems Review of Systems  Constitutional:  Negative for chills and fever.  HENT:  Negative for sore throat.   Eyes:  Negative for pain and redness.  Respiratory:  Negative for shortness of breath.   Cardiovascular:  Negative for chest pain.  Gastrointestinal:  Negative for abdominal pain, diarrhea, nausea and vomiting.  Genitourinary:  Positive for dysuria, flank pain and penile  discharge. Negative for decreased urine volume, difficulty urinating, frequency, genital sores, hematuria, penile pain, penile swelling, scrotal swelling, testicular pain and urgency.  Musculoskeletal:  Negative for back pain.  Skin:  Negative for rash.    Physical Exam Triage Vital Signs ED Triage Vitals  Enc Vitals Group     BP 09/10/21 1943 (!) 150/73     Pulse Rate 09/10/21 1943 88     Resp 09/10/21 1943 18     Temp 09/10/21 1943 98.2 F (36.8 C)     Temp Source 09/10/21 1943 Oral     SpO2 09/10/21 1943 97 %     Weight --      Height --      Head Circumference --      Peak Flow --      Pain Score 09/10/21 1944 5     Pain Loc --      Pain Edu? --      Excl. in [place]? --    No data found.  Updated Vital Signs BP (!) 150/73 (BP Location: Left Arm)   Pulse 88   Temp 98.2 F (36.8 C) (Oral)   Resp 18   SpO2 97%   Visual Acuity Right Eye Distance:   Left Eye Distance:   Bilateral Distance:    Right Eye Near:   Left Eye Near:    Bilateral Near:     Physical Exam Vitals reviewed.  Constitutional:      General: He is not in acute distress.    Appearance: Normal appearance. He is not ill-appearing.  HENT:     Head: Normocephalic and atraumatic.     Mouth/Throat:     Mouth: Mucous membranes are moist.     Comments: Moist mucous membranes Eyes:     Extraocular Movements: Extraocular movements intact.     Pupils: Pupils are equal, round, and reactive to light.  Cardiovascular:     Rate and Rhythm: Normal rate and regular rhythm.     Heart sounds: Normal heart sounds.  Pulmonary:     Effort: Pulmonary effort is normal.     Breath sounds: Normal breath sounds. No wheezing, rhonchi or rales.  Abdominal:     General: Bowel sounds are normal. There is no distension.     Palpations: Abdomen is soft. There is no mass.     Tenderness: There is no abdominal tenderness. There is no right CVA tenderness, left CVA tenderness, guarding or rebound. Negative signs include  Murphy's sign, Rovsing's sign and McBurney's sign.     Comments: No reproducible flank or abd pain  Genitourinary:    Comments: deferred Skin:    General: Skin is warm.     Capillary Refill: Capillary refill takes less than 2 seconds.     Comments: Good skin turgor  Neurological:     General: No focal deficit present.     Mental Status: He is alert and oriented to person, place, and time.  Psychiatric:        Mood and Affect: Mood normal.        Behavior: Behavior normal.     UC Treatments / Results  Labs (all labs ordered are listed, but only abnormal results are displayed) Labs Reviewed  POCT URINALYSIS DIPSTICK, ED / UC  CYTOLOGY, (ORAL, ANAL, URETHRAL) ANCILLARY ONLY    EKG   Radiology No results found.  Procedures Procedures (including critical care time)  Medications Ordered in UC Medications - No data to display  Initial Impression / Assessment and Plan / UC Course  I have reviewed the triage vital signs and the nursing notes.  Pertinent labs & imaging results that were available during my care of the patient were reviewed by me and considered in my medical decision making (see chart for details).     This patient is a very pleasant 20 y.o. year old male presenting with dysuria and penile discharge following new partner. Afebrile, nontachycardic, no reproducible abd pain or CVAT.  UA wnl, did not send culture.  Will send self-swab for G/C, trich. Safe sex precautions.   ED return precautions discussed. Patient verbalizes understanding and agreement.    Final Clinical Impressions(s) / UC Diagnoses   Final diagnoses:  Dysuria  Routine screening for STI (sexually transmitted infection)  Penile discharge     Discharge Instructions      -Your urine looks normal, you do not have a urinary or kidney infection. -We are testing for sexually transmitted diseases, this is a common cause of burning when peeing and penile discharge in young man.  We will  call in about 2 days with positive results.  Abstain until negative test results or treatment is complete. -I cannot rule out a kidney stone at this time, though it is unlikely based on your exam.  If you develop new symptoms like severe abdominal pain or severe back pain, head to the emergency department.   ED Prescriptions   None  PDMP not reviewed this encounter.   Rhys Martini, PA-C 09/10/21 2024

## 2021-09-10 NOTE — ED Triage Notes (Signed)
Pt c/o lt flank pain and cloudy urine x2 days. States had burning on urination last week and tx'd it by drinking lots of water.

## 2021-09-11 LAB — CYTOLOGY, (ORAL, ANAL, URETHRAL) ANCILLARY ONLY
Chlamydia: NEGATIVE
Comment: NEGATIVE
Comment: NEGATIVE
Comment: NORMAL
Neisseria Gonorrhea: NEGATIVE
Trichomonas: NEGATIVE

## 2021-10-24 ENCOUNTER — Emergency Department (HOSPITAL_COMMUNITY): Payer: 59

## 2021-10-24 ENCOUNTER — Encounter (HOSPITAL_COMMUNITY): Payer: Self-pay | Admitting: Emergency Medicine

## 2021-10-24 ENCOUNTER — Other Ambulatory Visit: Payer: Self-pay

## 2021-10-24 ENCOUNTER — Emergency Department (HOSPITAL_COMMUNITY)
Admission: EM | Admit: 2021-10-24 | Discharge: 2021-10-24 | Disposition: A | Discharge status: Home / Self Care | Payer: 59 | Attending: Emergency Medicine | Admitting: Emergency Medicine

## 2021-10-24 DIAGNOSIS — S6992XA Unspecified injury of left wrist, hand and finger(s), initial encounter: Secondary | ICD-10-CM | POA: Diagnosis present

## 2021-10-24 DIAGNOSIS — Z23 Encounter for immunization: Secondary | ICD-10-CM | POA: Insufficient documentation

## 2021-10-24 DIAGNOSIS — S61211A Laceration without foreign body of left index finger without damage to nail, initial encounter: Secondary | ICD-10-CM | POA: Diagnosis not present

## 2021-10-24 DIAGNOSIS — W540XXA Bitten by dog, initial encounter: Secondary | ICD-10-CM | POA: Diagnosis not present

## 2021-10-24 MED ORDER — IBUPROFEN 800 MG PO TABS
800.0000 mg | ORAL_TABLET | Freq: Once | ORAL | Status: AC
  Administered 2021-10-24: 800 mg via ORAL
  Filled 2021-10-24: qty 1

## 2021-10-24 MED ORDER — TETANUS-DIPHTH-ACELL PERTUSSIS 5-2.5-18.5 LF-MCG/0.5 IM SUSY
0.5000 mL | PREFILLED_SYRINGE | Freq: Once | INTRAMUSCULAR | Status: AC
  Administered 2021-10-24: 0.5 mL via INTRAMUSCULAR
  Filled 2021-10-24: qty 0.5

## 2021-10-24 MED ORDER — LIDOCAINE HCL (PF) 1 % IJ SOLN
10.0000 mL | Freq: Once | INTRAMUSCULAR | Status: AC
  Administered 2021-10-24: 10 mL
  Filled 2021-10-24: qty 30

## 2021-10-24 MED ORDER — AMOXICILLIN-POT CLAVULANATE 875-125 MG PO TABS: 1.0000 | ORAL_TABLET | Freq: Two times a day (BID) | ORAL | 0 refills | Status: AC

## 2021-10-24 MED ORDER — AMOXICILLIN-POT CLAVULANATE 875-125 MG PO TABS
1.0000 | ORAL_TABLET | Freq: Once | ORAL | Status: AC
  Administered 2021-10-24: 1 via ORAL
  Filled 2021-10-24: qty 1

## 2021-10-24 NOTE — Discharge Instructions (Addendum)
Take Augmentin twice daily for the next 7 days. Use the finger splint when you are awake during the day. You take Motrin and ibuprofen for pain. Your tetanus was updated today, should be good for the next 10 years less have another injury in which case 5 years. Have the sutures removed in 7 to 10 days.  He can go to an urgent care to the ED. Information provided above for care, basically keep it clean with soap and water and avoid any peroxide.  He can use Neosporin or bacitracin if you would like.

## 2021-10-24 NOTE — ED Triage Notes (Signed)
Pt states his dog bit his finger, L index finger. States she is UTD on her vaccines.

## 2021-10-24 NOTE — ED Provider Notes (Signed)
[facility]-EMERGENCY DEPT Provider Note   CSN: [id number] Arrival date & time: 10/24/21  1820     History Chief Complaint  Patient presents with   finger laceration    Marc Gardner is a 20 y.o. male.  HPI  Patient presents with dog bite to the left index finger.  Happened acutely earlier today, it is dog and is up-to-date on vaccinations.  Patient is unsure when his last tetanus was.  He has bleeding to the volar side of his left index finger, bleeding is improved with pressure.  He has not taken any analgesics.  Bending the finger aggravates the bleeding.  He has full range of motion to the index finger, denies any immunocompromise state.  Not diabetic.   History reviewed. No pertinent past medical history.  Patient Active Problem List   Diagnosis Date Noted   Chronic right shoulder pain 11/30/2018    History reviewed. No pertinent surgical history.     No family history on file.  Social History   Tobacco Use   Smoking status: Never   Smokeless tobacco: Never  Substance Use Topics   Alcohol use: No   Drug use: Yes    Types: Marijuana    Home Medications Prior to Admission medications   Medication Sig Start Date End Date Taking? Authorizing Provider  acetaminophen (TYLENOL) 500 MG tablet Take 1 tablet (500 mg total) by mouth every 6 (six) hours as needed. 10/15/19   Fawze, Mina A, PA-C  ibuprofen (ADVIL) 600 MG tablet Take 1 tablet (600 mg total) by mouth every 6 (six) hours as needed. 10/15/19   Michela Pitcher A, PA-C    Allergies    Patient has no known allergies.  Review of Systems   Review of Systems  Constitutional:  Negative for fever.  Musculoskeletal:  Positive for arthralgias and myalgias. Negative for joint swelling.  Skin:  Positive for wound.   Physical Exam Updated Vital Signs BP (!) 150/94 (BP Location: Right Arm)    Pulse 95    Temp 98.7 F (37.1 C) (Oral)    Resp 18    Ht 5\' 11"  (1.803 m)    Wt 90 kg    SpO2 97%     BMI 27.67 kg/m   Physical Exam Vitals and nursing note reviewed. Exam conducted with a chaperone present.  Constitutional:      General: He is not in acute distress.    Appearance: Normal appearance.  HENT:     Head: Normocephalic and atraumatic.  Eyes:     General: No scleral icterus.    Extraocular Movements: Extraocular movements intact.     Pupils: Pupils are equal, round, and reactive to light.  Cardiovascular:     Pulses: Normal pulses.  Musculoskeletal:        General: Tenderness and signs of injury present. Normal range of motion.     Comments: Full range of motion at the DIP and PIP joint.  No tenderness over the joint  Skin:    Capillary Refill: Capillary refill takes less than 2 seconds.     Coloration: Skin is not jaundiced.     Comments: 3.5 cm laceration to the left index finger distally.  Picture below  Neurological:     Mental Status: He is alert. Mental status is at baseline.     Coordination: Coordination normal.     ED Results / Procedures / Treatments   Labs (all labs ordered are listed, but only abnormal results are displayed)  Labs Reviewed - No data to display  EKG None  Radiology No results found.  Procedures .Marland KitchenLaceration Repair  Date/Time: 10/24/2021 8:02 PM Performed by: Theron Arista, PA-C Authorized by: Theron Arista, PA-C   Consent:    Consent obtained:  Verbal   Consent given by:  Patient   Risks discussed:  Infection, need for additional repair, pain, poor cosmetic result and poor wound healing   Alternatives discussed:  No treatment and delayed treatment Universal protocol:    Procedure explained and questions answered to patient or proxy's satisfaction: yes     Relevant documents present and verified: yes     Test results available: yes     Imaging studies available: yes     Required blood products, implants, devices, and special equipment available: yes     Site/side marked: yes     Immediately prior to procedure, a time out  was called: yes     Patient identity confirmed:  Verbally with patient Anesthesia:    Anesthesia method:  Local infiltration   Local anesthetic:  Lidocaine 1% w/o epi Laceration details:    Location:  Finger   Finger location:  L index finger   Length (cm):  3.5   Depth (mm):  2 Pre-procedure details:    Preparation:  Patient was prepped and draped in usual sterile fashion and imaging obtained to evaluate for foreign bodies Exploration:    Limited defect created (wound extended): no     Hemostasis achieved with:  Direct pressure   Imaging obtained: x-ray     Imaging outcome: foreign body not noted     Wound exploration: wound explored through full range of motion and entire depth of wound visualized     Contaminated: yes   Treatment:    Area cleansed with:  Povidone-iodine   Amount of cleaning:  Extensive   Irrigation solution:  Sterile saline   Irrigation volume:  2L   Irrigation method:  Pressure wash   Visualized foreign bodies/material removed: no     Debridement:  None Skin repair:    Repair method:  Sutures   Suture size:  4-0   Suture material:  Nylon   Suture technique:  Simple interrupted   Number of sutures:  4 Approximation:    Approximation:  Loose Repair type:    Repair type:  Simple Post-procedure details:    Dressing:  Splint for protection   Procedure completion:  Tolerated well, no immediate complications   Medications Ordered in ED Medications  lidocaine (PF) (XYLOCAINE) 1 % injection 10 mL (has no administration in time range)  Tdap (BOOSTRIX) injection 0.5 mL (has no administration in time range)  ibuprofen (ADVIL) tablet 800 mg (has no administration in time range)    ED Course  I have reviewed the triage vital signs and the nursing notes.  Pertinent labs & imaging results that were available during my care of the patient were reviewed by me and considered in my medical decision making (see chart for details).    MDM Rules/Calculators/A&P                          Tetanus updated.  Initial dose of Augmentin given here, prescription prescribed.  Wound was extensively irrigated with 2 L of nasal saline by myself personally.  Patient has good range of motion, no tendon involvement.  Does not involve the nailbed or specifically the joint.  X-ray ordered to assess for foreign bodies, negative.  No  foreign bodies visualized during physical exam.  Nerve block performed and patient tolerated well.  Loose approximation due to contaminant nature of the dog bite wound, 4 sutures total to alleviate the gaping.  Patient has good cap refill, radial pulse 2+.  He is neurovascularly intact before and after the procedure.  Strict return precautions as well as care and signs of infections were discussed with the patient.  Patient discharged in stable condition, will also get brace to help keep his finger immobile.       Final Clinical Impression(s) / ED Diagnoses Final diagnoses:  None    Rx / DC Orders ED Discharge Orders     None        Theron Arista, Cordelia Poche 10/24/21 Darnelle Going, MD 10/24/21 2149
